# Patient Record
Sex: Female | Born: 2000 | Race: Black or African American | Hispanic: No | Marital: Single | Smoking: Never smoker
Health system: Southern US, Community
[De-identification: ages and names within clinical notes are randomized; demographics above are authoritative.]

---
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
## 2018-06-21 ENCOUNTER — Emergency Department
Admission: EM | Admit: 2018-06-21 | Discharge: 2018-06-21 | Disposition: A | Discharge status: Home / Self Care | Payer: Medicaid Other | Attending: Student in an Organized Health Care Education/Training Program | Admitting: Student in an Organized Health Care Education/Training Program

## 2018-06-21 ENCOUNTER — Other Ambulatory Visit: Payer: Self-pay

## 2018-06-21 ENCOUNTER — Encounter: Payer: Self-pay | Admitting: Emergency Medicine

## 2018-06-21 DIAGNOSIS — Z79899 Other long term (current) drug therapy: Secondary | ICD-10-CM | POA: Insufficient documentation

## 2018-06-21 DIAGNOSIS — J020 Streptococcal pharyngitis: Secondary | ICD-10-CM | POA: Insufficient documentation

## 2018-06-21 MED ORDER — IBUPROFEN 100 MG/5ML PO SUSP
600.0000 mg | Freq: Once | ORAL | Status: AC
  Administered 2018-06-21: 600 mg via ORAL
  Filled 2018-06-21: qty 30

## 2018-06-21 MED ORDER — AMOXICILLIN 400 MG/5ML PO SUSR: 500.0000 mg | Freq: Two times a day (BID) | ORAL | 0 refills | Status: AC

## 2018-06-21 MED ORDER — IBUPROFEN 100 MG/5ML PO SUSP
ORAL | Status: AC
  Filled 2018-06-21: qty 30

## 2018-06-21 NOTE — Discharge Instructions (Signed)
Take tylenol every4 hours or ibuprofen every 6 hours for pain or fever.  See your primary care provider if not improving over the next few days.  Return to the ER for symptoms that change or worsen if unable to schedule an appointment.

## 2018-06-21 NOTE — ED Triage Notes (Signed)
Pt started with sore throat yesterday.  Tonsils enlarged with exudate.  Pt spitting secretions.  Clarified with pt X 2, she is able to swallow secretions but does not want to swallow them because it hurts.  No drooling. Airway intact. VSS

## 2018-06-21 NOTE — ED Provider Notes (Signed)
[facility] Emergency Department Provider Note  ____________________________________________  Time seen: Approximately 1:23 PM  I have reviewed the triage vital signs and the nursing notes.   HISTORY  Chief Complaint Sore Throat    HPI Tina Sandoval is a 18 y.o. female who presents to the emergency department for treatment and evaluation of sore throat since yesterday. Pain is worse with swallowing. No alleviating measures attempted prior to arrival.   History reviewed. No pertinent past medical history.  There are no active problems to display for this patient.   History reviewed. No pertinent surgical history.  Prior to Admission medications   Medication Sig Start Date End Date Taking? Authorizing Provider  amoxicillin (AMOXIL) 400 MG/5ML suspension Take 6.3 mLs (500 mg total) by mouth 2 (two) times daily for 10 days. 06/21/18 07/01/18  Kristin Barcus, Rulon Eisenmenger B, FNP  ibuprofen (ADVIL,MOTRIN) 600 MG tablet Take 1 tablet (600 mg total) by mouth every 6 (six) hours as needed. 12/15/17   Enid Derry, PA-C    Allergies Patient has no known allergies.  History reviewed. No pertinent family history.  Social History Social History   Tobacco Use  . Smoking status: Never Smoker  . Smokeless tobacco: Never Used  Substance Use Topics  . Alcohol use: Never    Frequency: Never  . Drug use: Never    Review of Systems Constitutional: Positive for fever. Eyes: No visual changes. ENT: Positive for sore throat; negative for difficulty swallowing. Respiratory: Denies shortness of breath. Gastrointestinal: Negative for abdominal pain.  No nausea, no vomiting.  No diarrhea.  Genitourinary: Negative for dysuria. Negative for decrease in need to void. Musculoskeletal: Negative for generalized body aches. Skin: Negative for rash. Neurological: Negative for headaches, negative for focal weakness or  numbness.  ____________________________________________   PHYSICAL EXAM:  VITAL SIGNS: ED Triage Vitals  Enc Vitals Group     BP 06/21/18 1253 123/74     Pulse Rate 06/21/18 1253 (!) 105     Resp 06/21/18 1253 16     Temp 06/21/18 1253 99.5 F (37.5 C)     Temp Source 06/21/18 1253 Oral     SpO2 06/21/18 1253 100 %     Weight 06/21/18 1255 125 lb (56.7 kg)     Height 06/21/18 1255 5\' 8"  (1.727 m)     Head Circumference --      Peak Flow --      Pain Score 06/21/18 1255 8     Pain Loc --      Pain Edu? --      Excl. in GC? --     Constitutional: Alert and oriented. Well appearing and in no acute distress. Eyes: Conjunctivae are normal.  Head: Atraumatic. Nose: No congestion/rhinnorhea. Mouth/Throat: Mucous membranes are moist.  Oropharynx erythematous, tonsils 2+ with copious exudate. Uvula is midline. Ears: Right tympanic membrane appears normal. Left tympanic membrane appears normal. Neck: No stridor. Voice clear. Not muffled.  Lymphatic: Anterior cervical nodes tender and palpable. Cardiovascular: Normal rate, regular rhythm. Good peripheral circulation. Respiratory: Normal respiratory effort. Lungs CTAB. Gastrointestinal: Soft and nontender. Musculoskeletal: FROM of neck, upper and lower extremities. Neurologic:  Normal speech and language. No gross focal neurologic deficits are appreciated. Skin:  Skin is warm, dry and intact. No rash noted Psychiatric: Mood and affect are normal. Speech and behavior are normal.  ____________________________________________   LABS (all labs ordered are listed, but only abnormal results are displayed)  Labs Reviewed - No data to display ____________________________________________  EKG  Not indiated ____________________________________________  RADIOLOGY  Not indicated. ____________________________________________   PROCEDURES  Procedure(s) performed: None  Critical Care performed:  No ____________________________________________   INITIAL IMPRESSION / ASSESSMENT AND PLAN / ED COURSE  18 year old female presenting to the ER for sore throat. Copious exudate bilateral tonsils. She will be treated with amoxicillin and ibuprofen. Per her request, liquid medication prescribed due to pain with swallowing. She was advised to follow up with primary care or return to the ER for symptoms that are not improving over the next couple of days or sooner if she gets worse.  Pertinent labs & imaging results that were available during my care of the patient were reviewed by me and considered in my medical decision making (see chart for details). ____________________________________________  Discharge Medication List as of 06/21/2018  1:26 PM    START taking these medications   Details  amoxicillin (AMOXIL) 400 MG/5ML suspension Take 6.3 mLs (500 mg total) by mouth 2 (two) times daily for 10 days., Starting Sun 06/21/2018, Until Wed 07/01/2018, Normal        FINAL CLINICAL IMPRESSION(S) / ED DIAGNOSES  Final diagnoses:  Strep throat    If controlled substance prescribed during this visit, 12 month history viewed on the [place] prior to issuing an initial prescription for Schedule II or III opiod.   Note:  This document was prepared using Dragon voice recognition software and may include unintentional dictation errors.    Chinita Pester, FNP 06/23/18 Leanord Hawking    Willy Eddy, MD 06/23/18 1640

## 2018-06-21 NOTE — ED Notes (Signed)
See triage note.sore throat since yesterday  Increased pain with swallowing

## 2020-01-18 NOTE — Progress Notes (Deleted)
PCP:  Patient, No Pcp Per   No chief complaint on file.    HPI:      Ms. Brinley Rosete Davonne Baby is a 19 y.o. No obstetric history on file. whose LMP was No LMP recorded., presents today for her NP annual examination.  Her menses are {norm/abn:715}, lasting {number:22536} days.  Dysmenorrhea {dysmen:716}. She {does:18564} have intermenstrual bleeding.  Sex activity: {sex active:315163}.  Last Pap: N/A due to age Hx of STDs: {STD hx:14358}  There is no FH of breast cancer. There is no FH of ovarian cancer. The patient {does:18564} do self-breast exams.  Tobacco use: {tob:20664} Alcohol use: {Alcohol:11675} No drug use.  Exercise: {exercise:31265}  She {does:18564} get adequate calcium and Vitamin D in her diet.    The pregnancy intention screening data noted above was reviewed. Potential methods of contraception were discussed. The patient elected to proceed with {Upstream End Methods:24109}.    No past medical history on file.  No past surgical history on file.  No family history on file.  Social History   Socioeconomic History   Marital status: Single    Spouse name: Not on file   Number of children: Not on file   Years of education: Not on file   Highest education level: Not on file  Occupational History   Not on file  Tobacco Use   Smoking status: Never Smoker   Smokeless tobacco: Never Used  Substance and Sexual Activity   Alcohol use: Never   Drug use: Never   Sexual activity: Not on file  Other Topics Concern   Not on file  Social History Narrative   Not on file   Social Determinants of Health   Financial Resource Strain:    Difficulty of Paying Living Expenses: Not on file  Food Insecurity:    Worried About Running Out of Food in the Last Year: Not on file   Ran Out of Food in the Last Year: Not on file  Transportation Needs:    Lack of Transportation (Medical): Not on file   Lack of Transportation (Non-Medical): Not on file    Physical Activity:    Days of Exercise per Week: Not on file   Minutes of Exercise per Session: Not on file  Stress:    Feeling of Stress : Not on file  Social Connections:    Frequency of Communication with Friends and Family: Not on file   Frequency of Social Gatherings with Friends and Family: Not on file   Attends Religious Services: Not on file   Active Member of Clubs or Organizations: Not on file   Attends Banker Meetings: Not on file   Marital Status: Not on file  Intimate Partner Violence:    Fear of Current or Ex-Partner: Not on file   Emotionally Abused: Not on file   Physically Abused: Not on file   Sexually Abused: Not on file     Current Outpatient Medications:    ibuprofen (ADVIL,MOTRIN) 600 MG tablet, Take 1 tablet (600 mg total) by mouth every 6 (six) hours as needed., Disp: 30 tablet, Rfl: 0     ROS:  Review of Systems BREAST: No symptoms   Objective: There were no vitals taken for this visit.   OBGyn Exam  Results: No results found for this or any previous visit (from the past 24 hour(s)).  Assessment/Plan: No diagnosis found.  No orders of the defined types were placed in this encounter.  GYN counsel {counseling:16159}     F/U  No follow-ups on file.  Cayne Yom B. Malin Sambrano, PA-C 01/18/2020 4:49 PM

## 2020-01-19 ENCOUNTER — Ambulatory Visit: Payer: Medicaid Other | Admitting: Obstetrics and Gynecology

## 2020-01-30 IMAGING — CT CT HEAD W/O CM
3 series · 16 of 45 positions shown, 19 images · non-contrast
Comparison: None.

CLINICAL DATA: Headache

EXAM:
CT HEAD WITHOUT CONTRAST
TECHNIQUE: Contiguous axial images were obtained from the base of the skull
through the vertex without intravenous contrast.

[Series 3: head wo · axial · 0.37mm/px · z∈[+410,+525]mm · 10 of 28 slices shown, 13 images]
[im 3/28  brain]
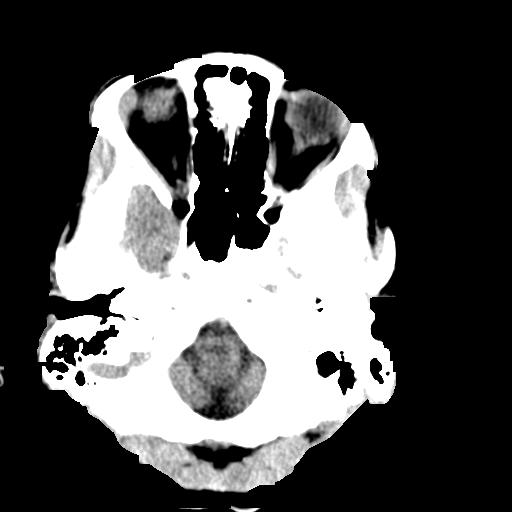
[im 3/28  bone]
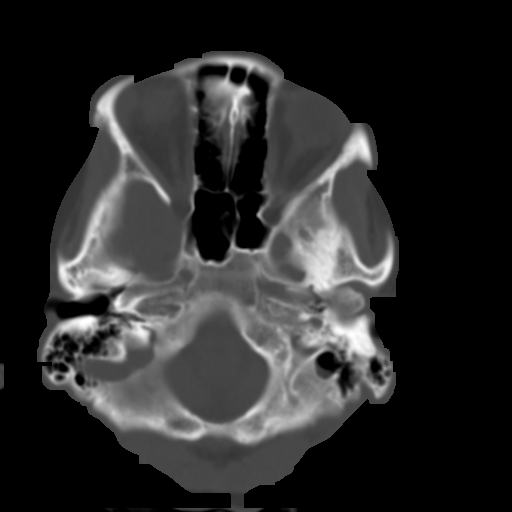
[im 5/28  brain]
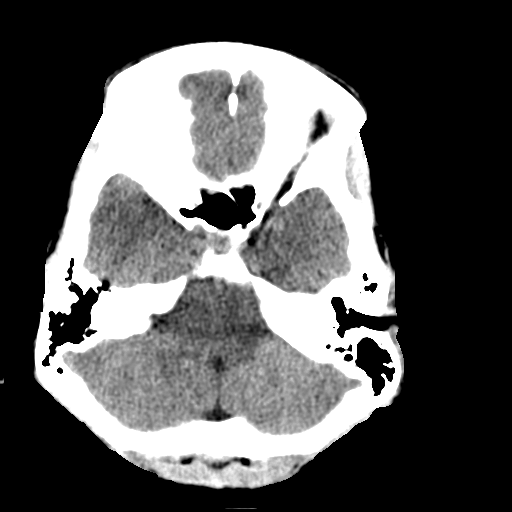
[im 8/28  brain]
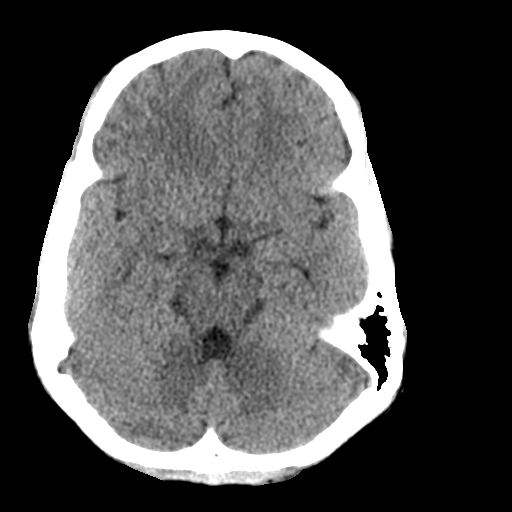
[im 11/28  brain]
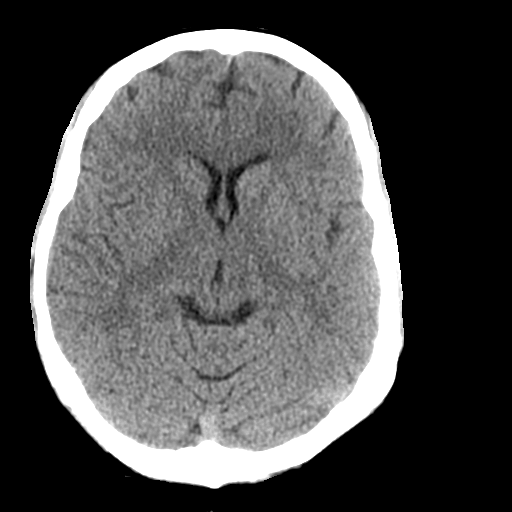
[im 13/28  brain]
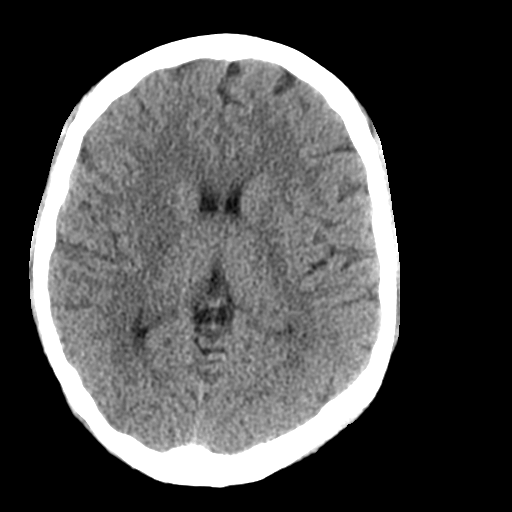
[im 13/28  bone]
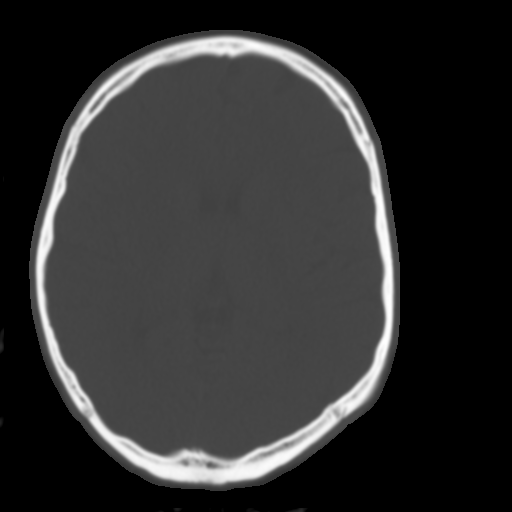
[im 16/28  brain]
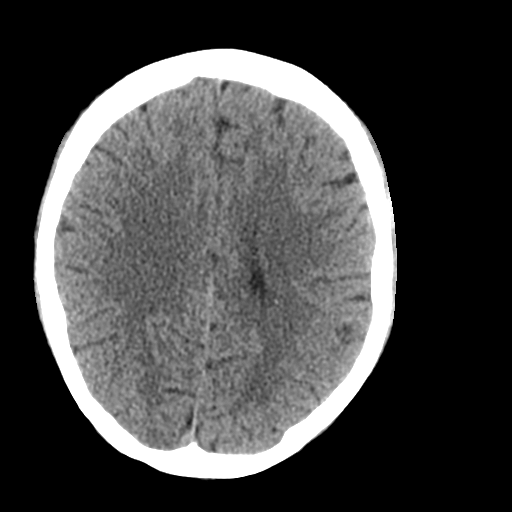
[im 18/28  brain]
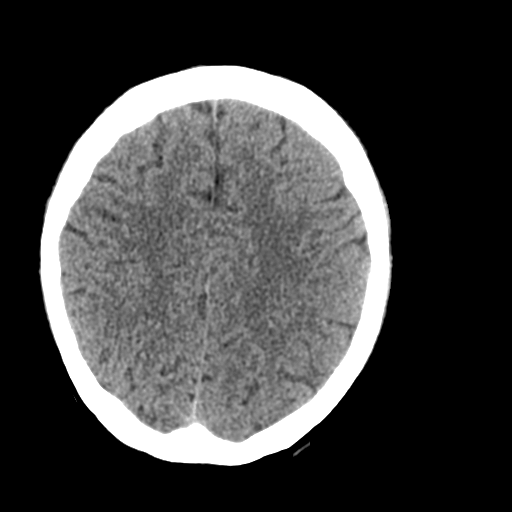
[im 21/28  brain]
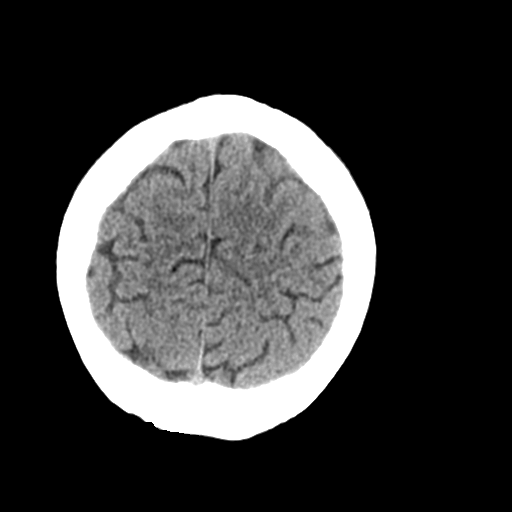
[im 24/28  brain]
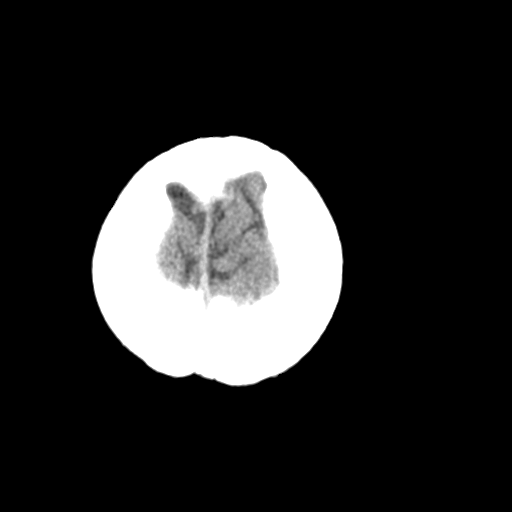
[im 24/28  bone]
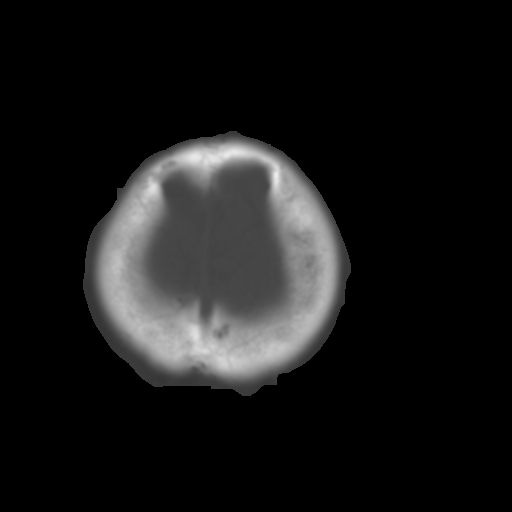
[im 26/28  brain]
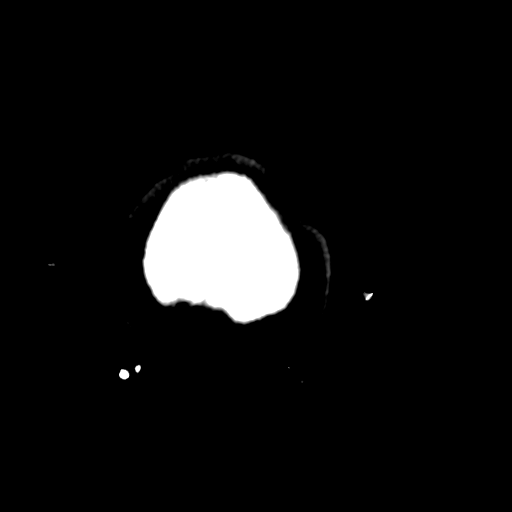

[Series 4: coronal soft tissue · coronal · 0.27mm/px · 3 of 59 slices shown]
[im 20/59  brain]
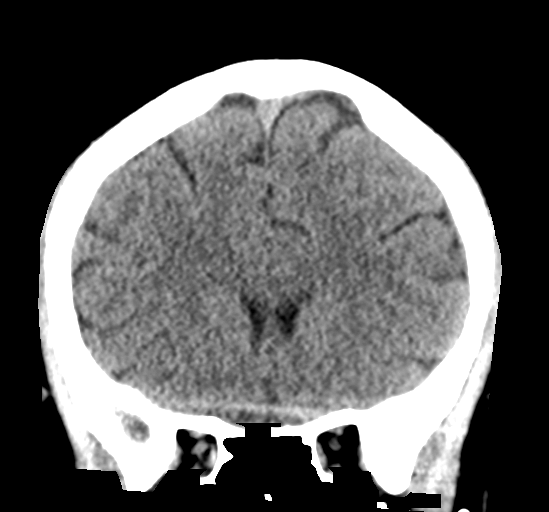
[im 26/59  brain]
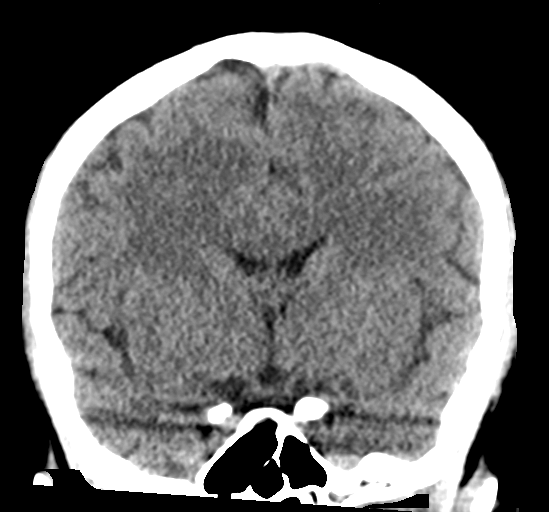
[im 33/59  brain]
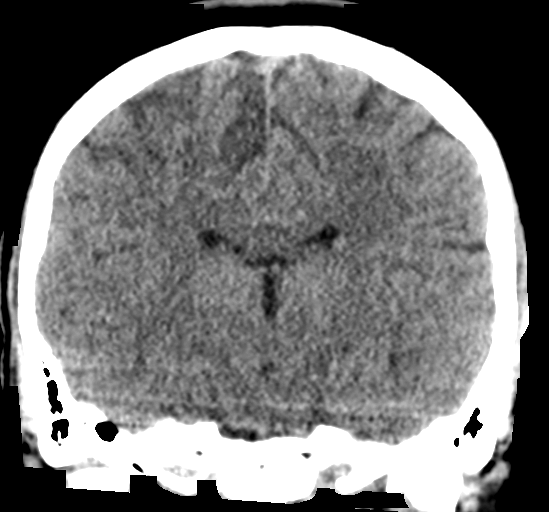

[Series 5: sagittal soft tissue · sagittal · 0.27mm/px · 3 of 50 slices shown]
[im 17/50  brain]
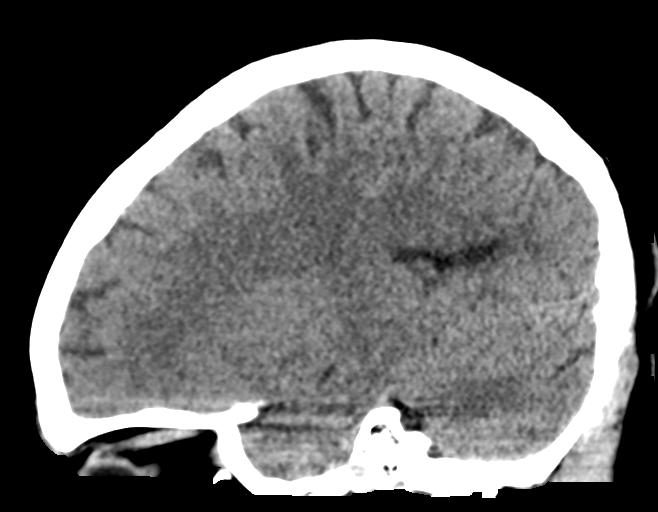
[im 25/50  brain]
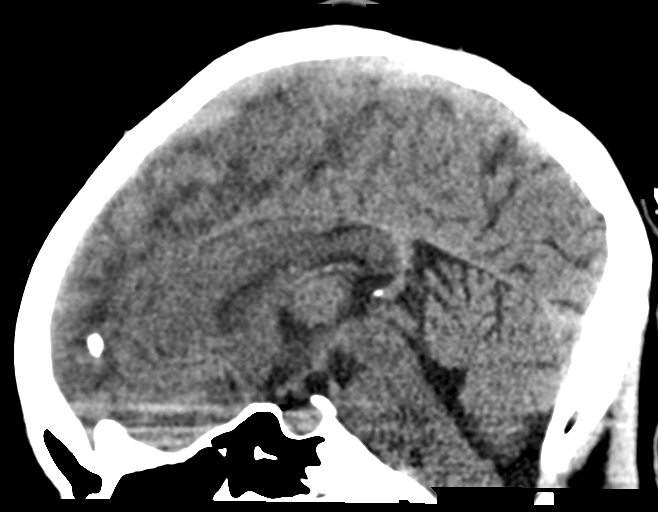
[im 33/50  brain]
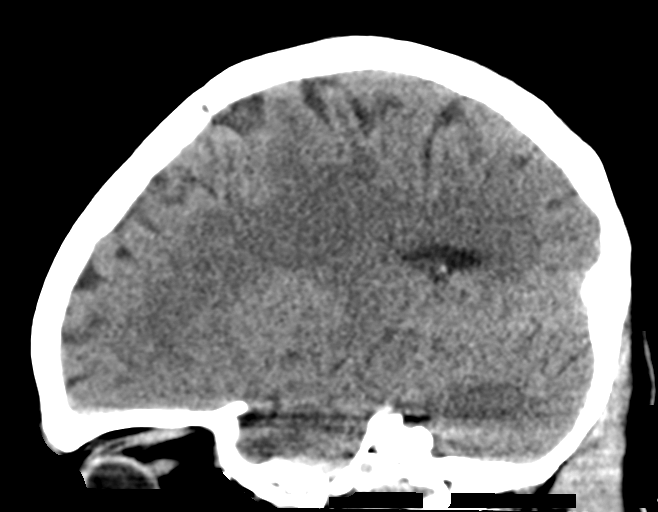

[16 of 45 positions shown; findings below may reference images not displayed]

FINDINGS: Brain: No evidence of acute infarction, hemorrhage, hydrocephalus,
extra-axial collection or mass lesion/mass effect.

Vascular: No hyperdense vessel or unexpected calcification.

Skull: Normal. Negative for fracture or focal lesion.

Sinuses/Orbits: No acute finding.

Other: None
IMPRESSION: Negative non contrasted CT appearance of the brain
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
## 2024-07-06 ENCOUNTER — Ambulatory Visit: Admitting: Nurse Practitioner
